# Patient Record
Sex: Male | Born: 1945 | Hispanic: Yes | Marital: Married | Smoking: Never smoker
Health system: Southern US, Community
[De-identification: ages and names within clinical notes are randomized; demographics above are authoritative.]

## PROBLEM LIST (undated history)

## (undated) DIAGNOSIS — E785 Hyperlipidemia, unspecified: Secondary | ICD-10-CM

## (undated) DIAGNOSIS — I10 Essential (primary) hypertension: Secondary | ICD-10-CM

## (undated) DIAGNOSIS — E119 Type 2 diabetes mellitus without complications: Secondary | ICD-10-CM

---

## 2007-02-26 ENCOUNTER — Emergency Department: Payer: Self-pay | Admitting: Emergency Medicine

## 2007-02-26 ENCOUNTER — Other Ambulatory Visit: Payer: Self-pay

## 2007-03-04 ENCOUNTER — Emergency Department: Payer: Self-pay | Admitting: Emergency Medicine

## 2007-03-04 ENCOUNTER — Emergency Department: Payer: Self-pay

## 2007-03-06 ENCOUNTER — Emergency Department: Payer: Self-pay | Admitting: Emergency Medicine

## 2009-04-11 ENCOUNTER — Ambulatory Visit: Payer: Self-pay | Admitting: Ophthalmology

## 2009-04-24 ENCOUNTER — Ambulatory Visit: Payer: Self-pay | Admitting: Ophthalmology

## 2009-06-11 ENCOUNTER — Emergency Department: Payer: Self-pay | Admitting: Emergency Medicine

## 2016-08-23 ENCOUNTER — Emergency Department: Payer: Medicare Other

## 2016-08-23 ENCOUNTER — Encounter: Payer: Self-pay | Admitting: Emergency Medicine

## 2016-08-23 ENCOUNTER — Emergency Department
Admission: EM | Admit: 2016-08-23 | Discharge: 2016-08-24 | Disposition: A | Discharge status: Home / Self Care | Payer: Medicare Other | Attending: Emergency Medicine | Admitting: Emergency Medicine

## 2016-08-23 DIAGNOSIS — I10 Essential (primary) hypertension: Secondary | ICD-10-CM | POA: Insufficient documentation

## 2016-08-23 DIAGNOSIS — E1165 Type 2 diabetes mellitus with hyperglycemia: Secondary | ICD-10-CM | POA: Diagnosis not present

## 2016-08-23 DIAGNOSIS — R1084 Generalized abdominal pain: Secondary | ICD-10-CM | POA: Insufficient documentation

## 2016-08-23 DIAGNOSIS — R0602 Shortness of breath: Secondary | ICD-10-CM | POA: Diagnosis not present

## 2016-08-23 DIAGNOSIS — R739 Hyperglycemia, unspecified: Secondary | ICD-10-CM

## 2016-08-23 DIAGNOSIS — R600 Localized edema: Secondary | ICD-10-CM | POA: Insufficient documentation

## 2016-08-23 DIAGNOSIS — R109 Unspecified abdominal pain: Secondary | ICD-10-CM | POA: Diagnosis present

## 2016-08-23 HISTORY — DX: Type 2 diabetes mellitus without complications: E11.9

## 2016-08-23 HISTORY — DX: Essential (primary) hypertension: I10

## 2016-08-23 HISTORY — DX: Hyperlipidemia, unspecified: E78.5

## 2016-08-23 LAB — CBC
HEMATOCRIT: 37.7 % — AB (ref 40.0–52.0)
HEMOGLOBIN: 13.2 g/dL (ref 13.0–18.0)
MCH: 33.4 pg (ref 26.0–34.0)
MCHC: 35.1 g/dL (ref 32.0–36.0)
MCV: 95.2 fL (ref 80.0–100.0)
Platelets: 246 10*3/uL (ref 150–440)
RBC: 3.96 MIL/uL — ABNORMAL LOW (ref 4.40–5.90)
RDW: 13 % (ref 11.5–14.5)
WBC: 5.4 10*3/uL (ref 3.8–10.6)

## 2016-08-23 LAB — URINALYSIS, COMPLETE (UACMP) WITH MICROSCOPIC
Bacteria, UA: NONE SEEN
Bilirubin Urine: NEGATIVE
Glucose, UA: >=500 mg/dL — AB
Ketones, ur: NEGATIVE mg/dL
Leukocytes, UA: NEGATIVE
Nitrite: NEGATIVE
SPECIFIC GRAVITY, URINE: 1.015 (ref 1.005–1.030)
pH: 7 (ref 5.0–8.0)

## 2016-08-23 LAB — ETHANOL

## 2016-08-23 LAB — COMPREHENSIVE METABOLIC PANEL
ALBUMIN: 2.1 g/dL — AB (ref 3.5–5.0)
ALK PHOS: 117 U/L (ref 38–126)
ALT: 29 U/L (ref 17–63)
ANION GAP: 4 — AB (ref 5–15)
AST: 42 U/L — ABNORMAL HIGH (ref 15–41)
BILIRUBIN TOTAL: 0.3 mg/dL (ref 0.3–1.2)
BUN: 25 mg/dL — ABNORMAL HIGH (ref 6–20)
CALCIUM: 7.6 mg/dL — AB (ref 8.9–10.3)
CO2: 26 mmol/L (ref 22–32)
Chloride: 109 mmol/L (ref 101–111)
Creatinine, Ser: 1.43 mg/dL — ABNORMAL HIGH (ref 0.61–1.24)
GFR calc Af Amer: 57 mL/min — ABNORMAL LOW (ref >60)
GFR calc non Af Amer: 49 mL/min — ABNORMAL LOW (ref >60)
GLUCOSE: 442 mg/dL — AB (ref 65–99)
POTASSIUM: 4.2 mmol/L (ref 3.5–5.1)
SODIUM: 139 mmol/L (ref 135–145)
TOTAL PROTEIN: 4.9 g/dL — AB (ref 6.5–8.1)

## 2016-08-23 LAB — LIPASE, BLOOD: Lipase: 16 U/L (ref 11–51)

## 2016-08-23 MED ORDER — IOPAMIDOL (ISOVUE-300) INJECTION 61%
100.0000 mL | Freq: Once | INTRAVENOUS | Status: AC | PRN
  Administered 2016-08-23: 100 mL via INTRAVENOUS

## 2016-08-23 MED ORDER — MORPHINE SULFATE (PF) 4 MG/ML IV SOLN
INTRAVENOUS | Status: AC
  Filled 2016-08-23: qty 1

## 2016-08-23 MED ORDER — LORAZEPAM 2 MG/ML IJ SOLN
1.0000 mg | Freq: Once | INTRAMUSCULAR | Status: AC
  Administered 2016-08-23: 1 mg via INTRAVENOUS
  Filled 2016-08-23: qty 1

## 2016-08-23 MED ORDER — ONDANSETRON HCL 4 MG/2ML IJ SOLN
4.0000 mg | Freq: Once | INTRAMUSCULAR | Status: AC
  Administered 2016-08-23: 4 mg via INTRAVENOUS

## 2016-08-23 MED ORDER — MORPHINE SULFATE (PF) 4 MG/ML IV SOLN
4.0000 mg | Freq: Once | INTRAVENOUS | Status: AC
  Administered 2016-08-23: 4 mg via INTRAVENOUS

## 2016-08-23 MED ORDER — ONDANSETRON HCL 4 MG/2ML IJ SOLN
INTRAMUSCULAR | Status: AC
  Filled 2016-08-23: qty 2

## 2016-08-23 MED ORDER — SODIUM CHLORIDE 0.9 % IV SOLN
Freq: Once | INTRAVENOUS | Status: AC
  Administered 2016-08-23: 22:00:00 via INTRAVENOUS

## 2016-08-23 MED ORDER — INSULIN ASPART PROT & ASPART (70-30 MIX) 100 UNIT/ML ~~LOC~~ SUSP
15.0000 [IU] | Freq: Once | SUBCUTANEOUS | Status: AC
  Administered 2016-08-23: 15 [IU] via SUBCUTANEOUS
  Filled 2016-08-23: qty 15

## 2016-08-23 MED ORDER — IOPAMIDOL (ISOVUE-300) INJECTION 61%
30.0000 mL | Freq: Once | INTRAVENOUS | Status: AC
  Administered 2016-08-23: 30 mL via ORAL

## 2016-08-23 NOTE — ED Notes (Signed)
Pt shaking to bilateral upper extremities. md in to reassess.

## 2016-08-23 NOTE — ED Notes (Signed)
Pt reports improved pain after morphine administration. Pt remains hypertensive, md aware.

## 2016-08-23 NOTE — ED Notes (Addendum)
Pt with orthopnea noted. Pt states he has been shob for "weeks". Pt with edema noted to bilateral pedal and tibial approx 3+ non pitting. Pt's daughter states has been present for "weeks" but has worsened over last day. Pt with slight jaundice noted to facial skin. Pt denies emesis, last po intake at 1500 today. Pt's daughter states he drinks every weekend, but has not consumed alcohol this weekend. Pt with history of diabetes, htn, elevated cholesterol and diabetes. Information obtained with rafael, interpreter.

## 2016-08-23 NOTE — ED Triage Notes (Signed)
Pt states that he has been having abdominal pain since this AM. Pt denies N/V/D but has a distended abdomen at this time. Pt states that he has been having normal BM. Pt also has swollen left extremity and denies any injury or trauma to these areas. Pt is shaking in triage.

## 2016-08-23 NOTE — ED Notes (Signed)
Pt with intermittent shaking to bilateral upper extremities, dr. Mayford KnifeWilliams in speaking with family. Order for ativan received.

## 2016-08-23 NOTE — ED Notes (Signed)
Pt assisted to restroom.  

## 2016-08-23 NOTE — ED Notes (Signed)
Pt has finished po contrast. Clydie BraunKaren from ct notified.

## 2016-08-23 NOTE — ED Provider Notes (Signed)
[facility] Emergency Department Provider Note        Time seen: ----------------------------------------- 8:50 PM on 08/23/2016 -----------------------------------------    I have reviewed the triage vital signs and the nursing notes.   HISTORY  Chief Complaint Abdominal Pain    HPI Caleb Holmes is a 71 y.o. male who presents to ER for abdominal pain since this morning. Patient denies nausea, vomiting or diarrhea but has had a distended abdomen. Patient states she's been having normal bowel movements. Patient also describes bilateral lower extremity edema. He denies any injury or trauma to these areas. He's been shaking on arrival. Pain is 8 out of 10 and worse on the left and right abdomen.   Past Medical History:  Diagnosis Date  . Diabetes mellitus without complication (HCC)   . Hyperlipidemia   . Hypertension     There are no active problems to display for this patient.   History reviewed. No pertinent surgical history.  Allergies Patient has no known allergies.  Social History Social History  Substance Use Topics  . Smoking status: Never Smoker  . Smokeless tobacco: Never Used  . Alcohol use No    Review of Systems Constitutional: Negative for fever. Cardiovascular: Negative for chest pain. Respiratory: Positive for shortness of breath Gastrointestinal: Positive for abdominal pain, distention Genitourinary: Negative for dysuria. Musculoskeletal: Negative for back pain. Positive for edema Skin: Negative for rash. Neurological: Negative for headaches, focal weakness or numbness. Positive for tremor  10-point ROS otherwise negative.  ____________________________________________   PHYSICAL EXAM:  VITAL SIGNS: ED Triage Vitals  Enc Vitals Group     BP --      Pulse --      Resp --      Temp --      Temp src --      SpO2 --      Weight 08/23/16 2026 155 lb (70.3 kg)     Height 08/23/16 2026 5\' 7"  (1.702 m)    Head Circumference --      Peak Flow --      Pain Score 08/23/16 2027 8     Pain Loc --      Pain Edu? --      Excl. in GC? --     Constitutional: Alert and oriented. Mild distress Eyes: Conjunctivae are normal. PERRL. Normal extraocular movements. ENT   Head: Normocephalic and atraumatic.   Nose: No congestion/rhinnorhea.   Mouth/Throat: Mucous membranes are moist.   Neck: No stridor. Cardiovascular: Normal rate, regular rhythm. No murmurs, rubs, or gallops. Respiratory: Normal respiratory effort without tachypnea nor retractions. Breath sounds are clear and equal bilaterally. No wheezes/rales/rhonchi. Gastrointestinal: Soft and nontender. Normal bowel sounds Musculoskeletal: Nontender with normal range of motion in all extremities. Bilateral lower extremity pitting edema Neurologic:  Normal speech and language. No gross focal neurologic deficits are appreciated. Tremulous Skin:  Skin is warm, dry and intact. No rash noted. Psychiatric: Mood and affect are normal. Speech and behavior are normal.  ____________________________________________  EKG: Interpreted by me. Sinus rhythm with a rate 89 bpm, normal PR interval, normal QRS, normal QT interval. Normal axis.  ____________________________________________  ED COURSE:  Pertinent labs & imaging results that were available during my care of the patient were reviewed by me and considered in my medical decision making (see chart for details).  Patient presents to the ER with abdominal pain as well as tremor. Possible underlying cirrhosis or renal disease. We will assess with labs and imaging.  Clinical Course as of Aug 23 2237  Sat Aug 23, 2016  2110 Corrected calcium is 9.1  [JW]    Clinical Course User Index [JW] Emily FilbertJonathan E Neldon Shepard, MD   Procedures ____________________________________________   LABS (pertinent positives/negatives)  Labs Reviewed  COMPREHENSIVE METABOLIC PANEL - Abnormal; Notable for the  following:       Result Value   Glucose, Bld 442 (*)    BUN 25 (*)    Creatinine, Ser 1.43 (*)    Calcium 7.6 (*)    Total Protein 4.9 (*)    Albumin 2.1 (*)    AST 42 (*)    GFR calc non Af Amer 49 (*)    GFR calc Af Amer 57 (*)    Anion gap 4 (*)    All other components within normal limits  CBC - Abnormal; Notable for the following:    RBC 3.96 (*)    HCT 37.7 (*)    All other components within normal limits  URINALYSIS, COMPLETE (UACMP) WITH MICROSCOPIC - Abnormal; Notable for the following:    Color, Urine STRAW (*)    APPearance CLEAR (*)    Glucose, UA >=500 (*)    Hgb urine dipstick SMALL (*)    Protein, ur >=300 (*)    Squamous Epithelial / LPF 0-5 (*)    All other components within normal limits  LIPASE, BLOOD  ETHANOL    RADIOLOGY Images were viewed by me  Acute abdominal series IMPRESSION: Very small amount of ascites with mesenteric and subcutaneous edema of uncertain etiology.  1.5 cm filling defect along the proximal due to edema wall suspicious for small mass. Direct inspection recommended.  Unremarkable liver. No morphologic evidence of cirrhosis identified on this exam.  Abdominal aortic atherosclerosis.  Nonobstructing bilateral renal calculi.  Cardiomegaly and mild prostate enlargement.  IMPRESSION: Probable nephrolithiasis. No ureteral calculi are evident. Negative for bowel obstruction or perforation.  ____________________________________________  FINAL ASSESSMENT AND PLAN  Abdominal pain, hyperglycemia  Plan: Patient with labs and imaging as dictated above. Patient present to the ER with abdominal pain and distention. He also was tremulous. A lot of his findings are possibly alcohol related. He was also hyperglycemia because he had not had his 70/30 dosage of insulin tonight. He has received fluids as well as his evening dose of insulin.    Emily FilbertWilliams, Gerene Nedd E, MD   Note: This note was generated in part or whole with voice  recognition software. Voice recognition is usually quite accurate but there are transcription errors that can and very often do occur. I apologize for any typographical errors that were not detected and corrected.     Emily FilbertJonathan E [place], MD 08/23/16 [phone]

## 2016-08-23 NOTE — ED Notes (Signed)
Patient transported to CT 

## 2016-08-23 NOTE — ED Notes (Signed)
Dr. Mayford KnifeWilliams notified of continued hypertension 220/106. No new orders received.

## 2016-08-24 LAB — GLUCOSE, CAPILLARY: Glucose-Capillary: 339 mg/dL — ABNORMAL HIGH (ref 65–99)

## 2016-08-24 MED ORDER — FUROSEMIDE 20 MG PO TABS: 20.0000 mg | ORAL_TABLET | Freq: Every day | ORAL | 0 refills | Status: AC

## 2016-08-24 MED ORDER — CHLORDIAZEPOXIDE HCL 10 MG PO CAPS: 10.0000 mg | ORAL_CAPSULE | Freq: Three times a day (TID) | ORAL | 0 refills | Status: AC | PRN

## 2016-08-24 NOTE — ED Provider Notes (Signed)
Vital signs stable, blood pressure 140/70. Fingerstick 3:30. We'll discharge home with family, has medications at home. We'll give him a short course of Librium as needed to treat minor withdrawal symptoms at home. Trial of low-dose Lasix for his peripheral edema is discussed with the patient by Dr. Mayford KnifeWilliams.   Caleb CheekPhillip Charrie Mcconnon, MD 08/24/16 [phone]

## 2017-10-20 IMAGING — CT CT ABD-PELV W/ CM
2 of 5 series · 16 of 46 positions shown, 18 images · IV contrast (APPLIED)
Comparison: None.

CLINICAL DATA: Abdominal pain and distention.  Jaundice.

EXAM:
CT ABDOMEN AND PELVIS WITH CONTRAST
TECHNIQUE: Multidetector CT imaging of the abdomen and pelvis was performed
using the standard protocol following bolus administration of
intravenous contrast.
CONTRAST:  100mL [id number] IOPAMIDOL ([id number]) INJECTION 61%

[Series 2: routine abd/pel with · axial · 0.80mm/px · z∈[-492,-67]mm · 13 of 97 slices shown, 15 images]
[im 6/97  soft-tissue]
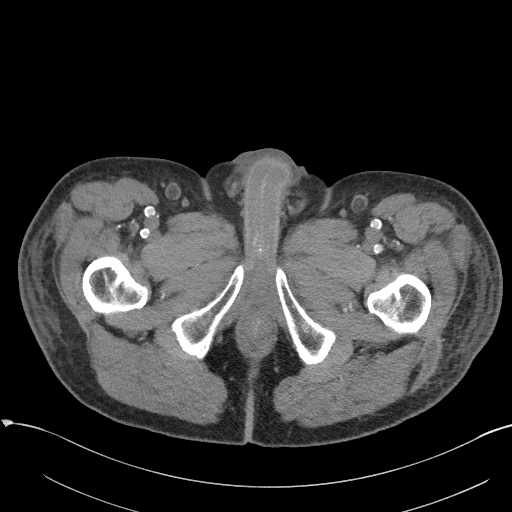
[im 6/97  bone]
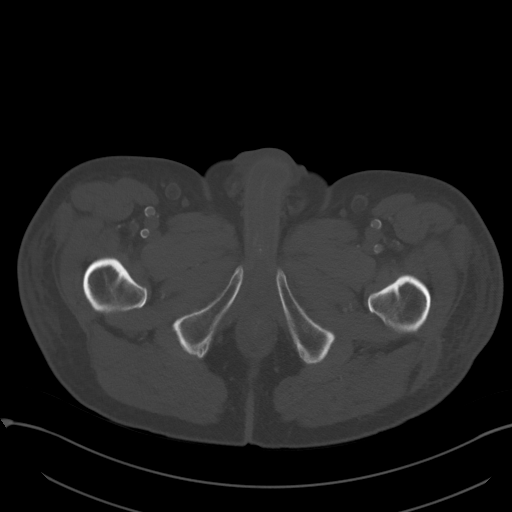
[im 16/97  soft-tissue]
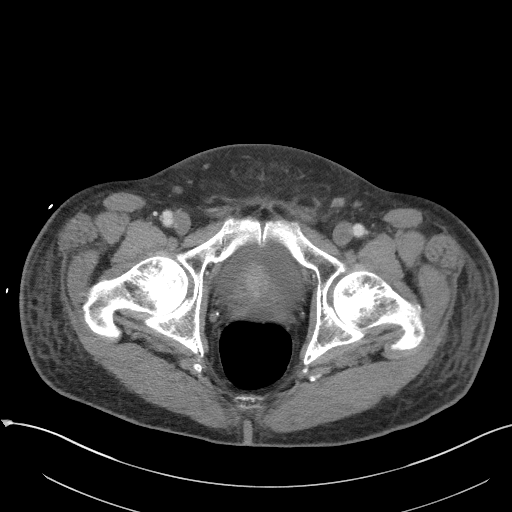
[im 21/97  soft-tissue]
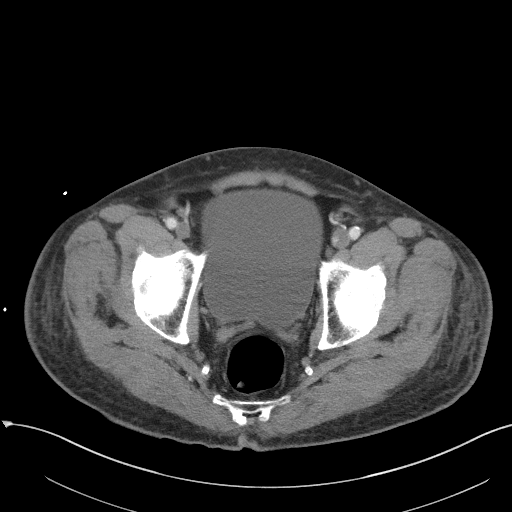
[im 26/97  soft-tissue]
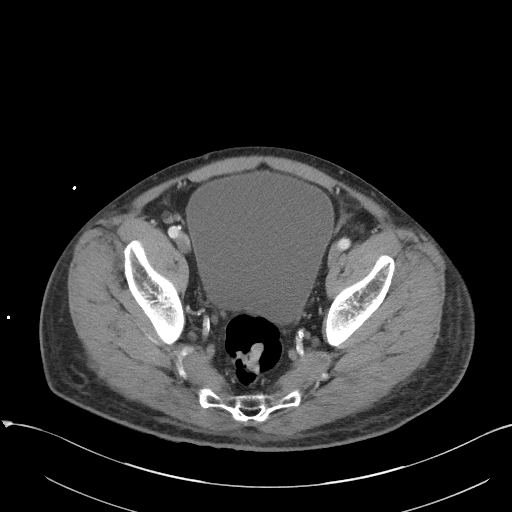
[im 36/97  soft-tissue]
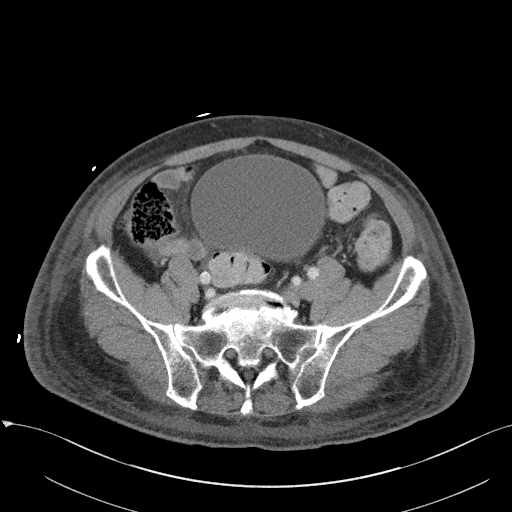
[im 41/97  soft-tissue]
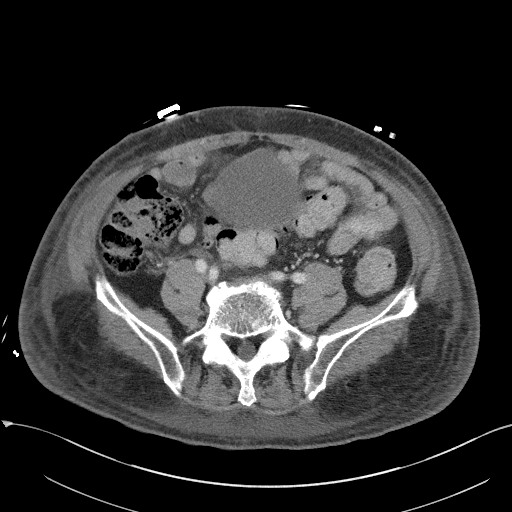
[im 51/97  soft-tissue]
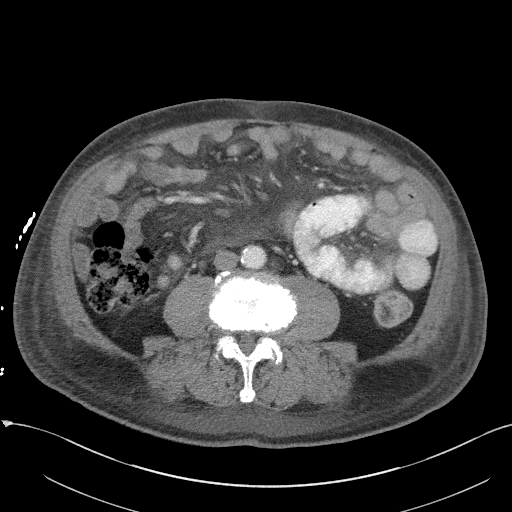
[im 56/97  soft-tissue]
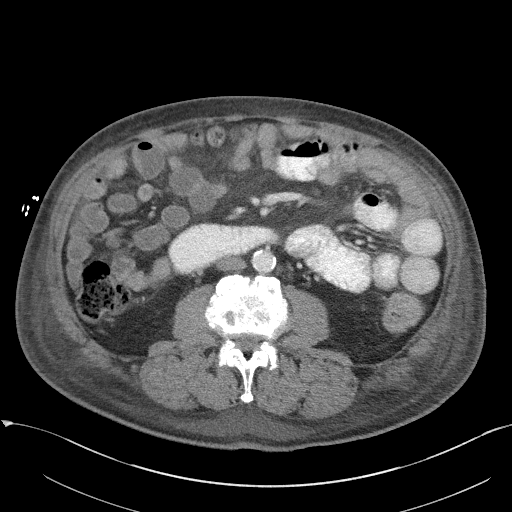
[im 61/97  soft-tissue]
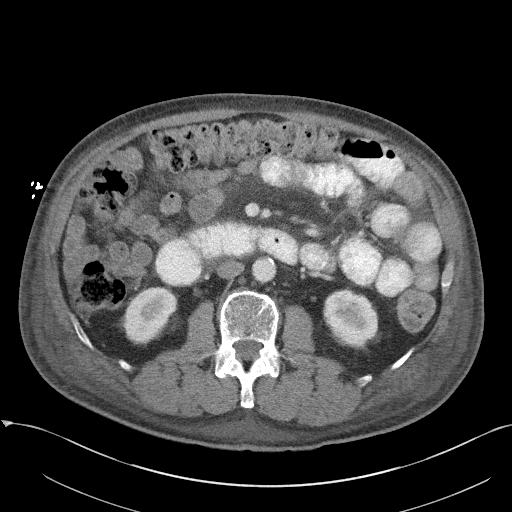
[im 61/97  bone]
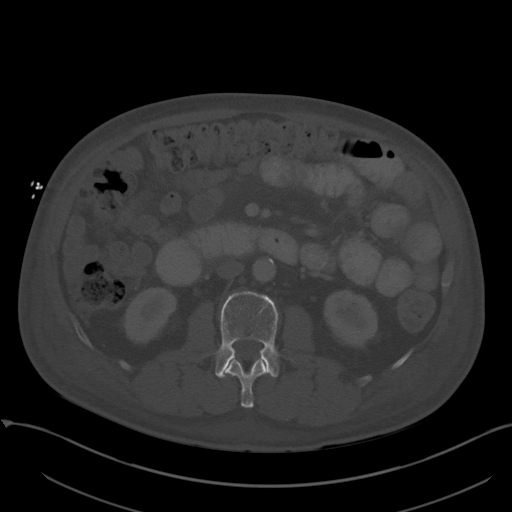
[im 71/97  soft-tissue]
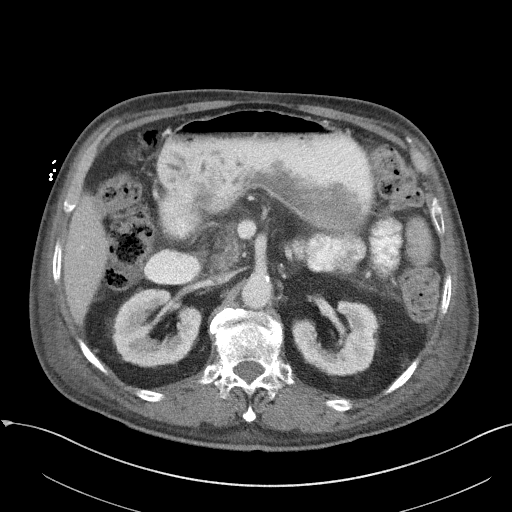
[im 76/97  soft-tissue]
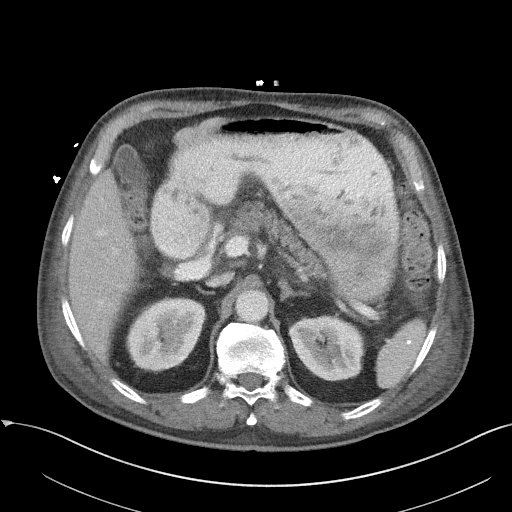
[im 81/97  soft-tissue]
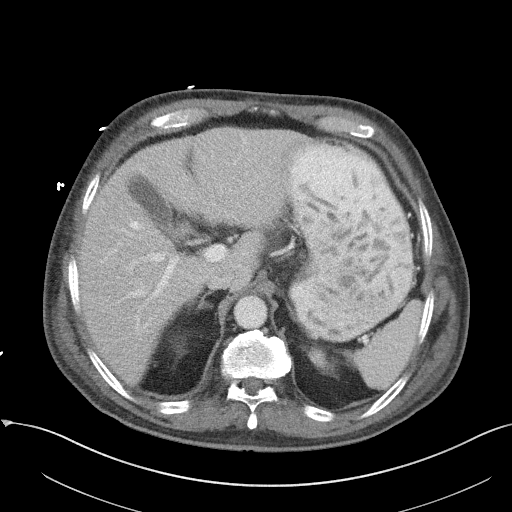
[im 91/97  soft-tissue]
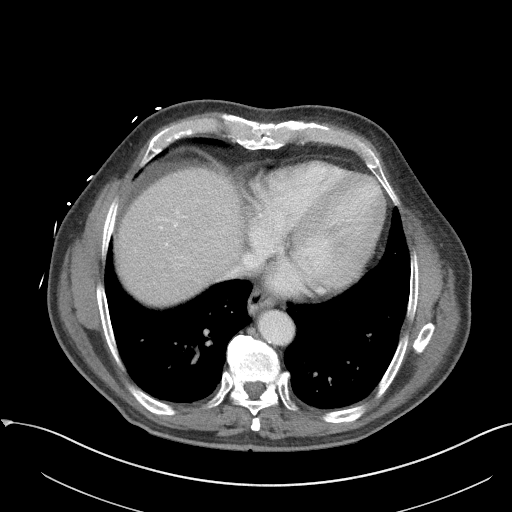

[Series 5: coronal st · coronal · 0.75mm/px · 3 of 93 slices shown]
[im 31/93  soft-tissue]
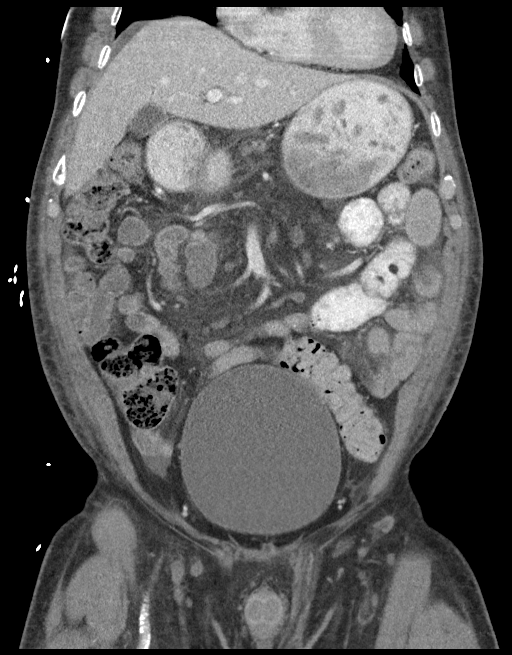
[im 41/93  soft-tissue]
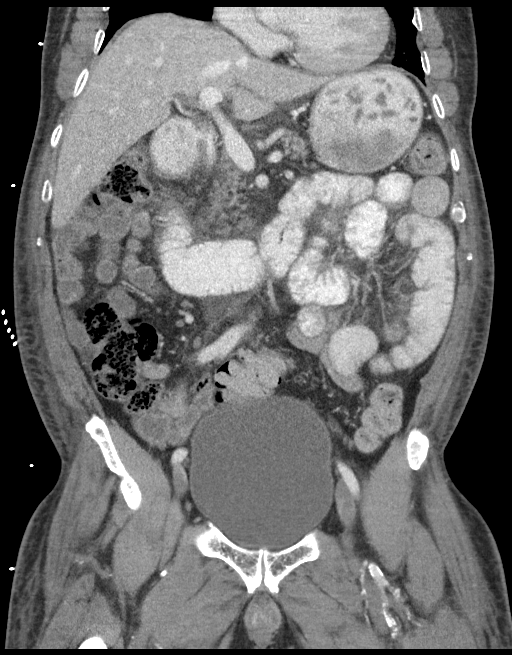
[im 52/93  soft-tissue]
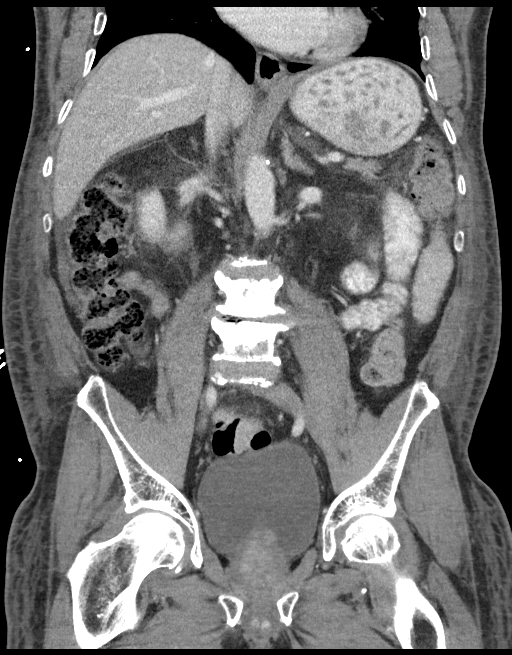

[16 of 46 positions shown; findings below may reference images not displayed]

FINDINGS: Lower chest: Cardiomegaly noted.

Hepatobiliary: The liver and gallbladder are unremarkable. No
morphologic features of cirrhosis identified. There is no evidence
of biliary dilatation.

Pancreas: Unremarkable

Spleen: Unremarkable

Adrenals/Urinary Tract: Nonobstructing bilateral renal calculi are
identified with a 6 mm calculus thin the mid -upper right kidney and
6 mm calculus within the mid left kidney. Bilateral renal cortical
atrophy noted. Small left renal cysts are present. The adrenal
glands and bladder are unremarkable.

Stomach/Bowel: A 1.5 cm filling defect along the proximal duodenal
wall is suspicious for small mass. There is no evidence of bowel
obstruction or definite bowel wall thickening. Mild gastric
distention noted.

Vascular/Lymphatic: Aortic atherosclerotic calcifications noted
without aneurysm. No enlarged lymph nodes are present.

Reproductive: Mild prostate enlargement noted.

Other: Small amount of ascites adjacent to the liver is noted.
Mesenteric and subcutaneous edema are present.

Musculoskeletal: No acute abnormalities identified. Multilevel
degenerative disc disease and spondylosis noted.
IMPRESSION: Very small amount of ascites with mesenteric and subcutaneous edema
of uncertain etiology.

1.5 cm filling defect along the proximal due to edema wall
suspicious for small mass. Direct inspection recommended.

Unremarkable liver. No morphologic evidence of cirrhosis identified
on this exam.

Abdominal aortic atherosclerosis.

Nonobstructing bilateral renal calculi.

Cardiomegaly and mild prostate enlargement.

## 2018-07-17 DEATH — deceased
